# Patient Record
Sex: Female | Born: 1960 | Race: White | Hispanic: No
Health system: Midwestern US, Community
[De-identification: ages and names within clinical notes are randomized; demographics above are authoritative.]

## PROBLEM LIST (undated history)

## (undated) DIAGNOSIS — S52571A Other intraarticular fracture of lower end of right radius, initial encounter for closed fracture: Secondary | ICD-10-CM

---

## 2006-02-24 IMAGING — US US PELVIS COMPLETE MODIFY
1 series · 14 of 25 positions shown · non-contrast
Comparison: None.

CLINICAL DATA: Menorrhagia.

TRANSABDOMINAL AND TRANSVAGINAL PELVIC ULTRASOUND 10/09/2004:
TECHNIQUE: Initially, transabdominal imaging was performed using the bladder as
an acoustical window. Subsequently, the bladder was emptied and transvaginal
imaging was performed.

[Series 1: unknown · 0.22mm/px · 14 of 57 slices shown]
[im 1/57]
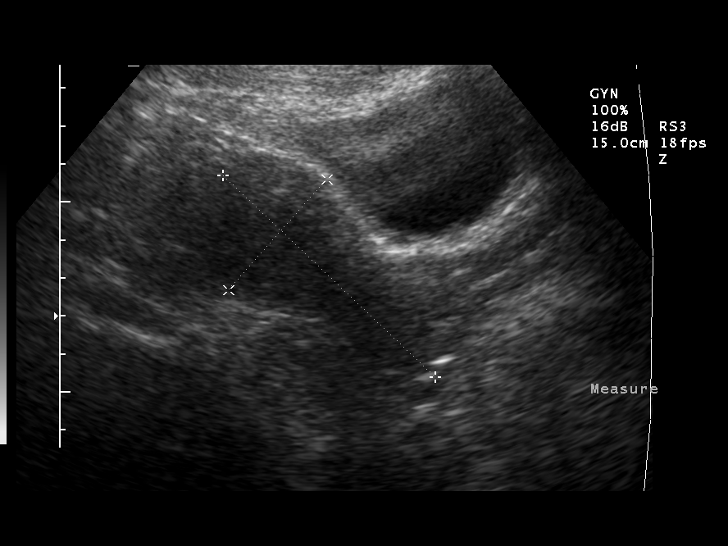
[im 5/57]
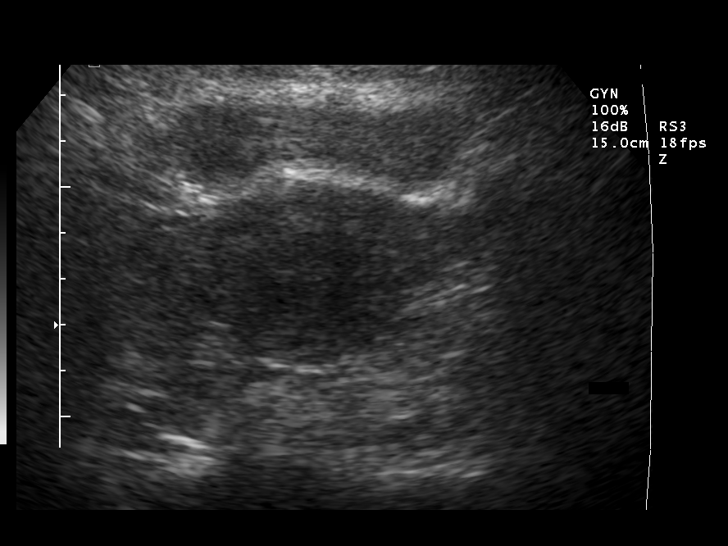
[im 10/57]
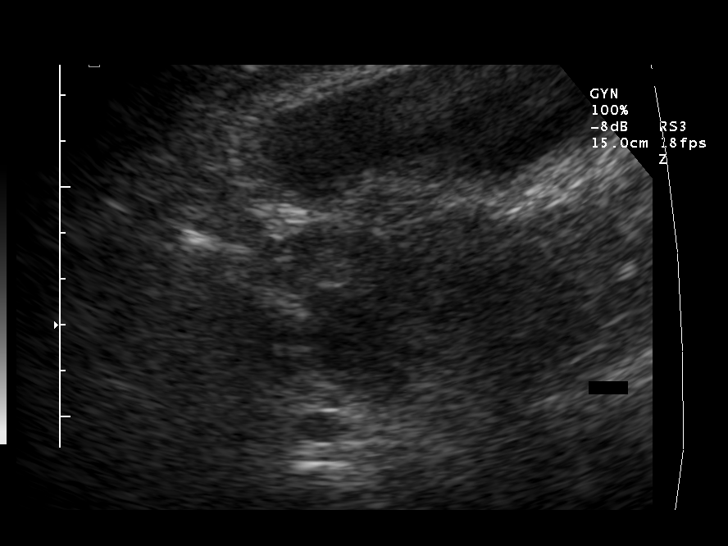
[im 15/57]
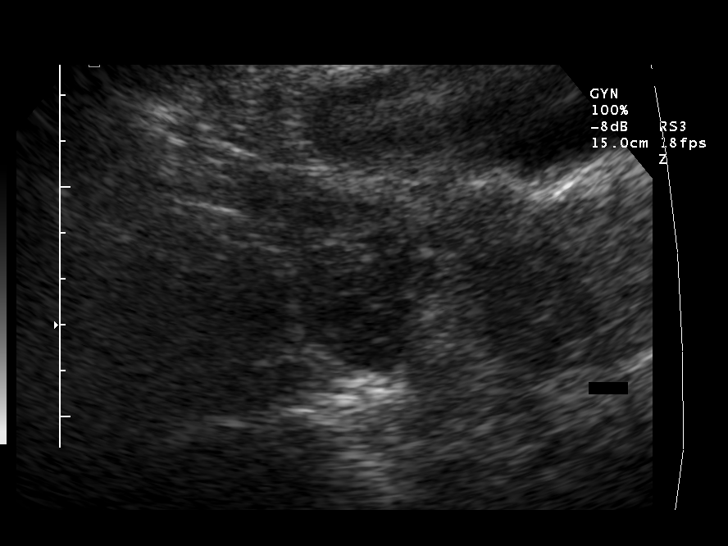
[im 19/57]
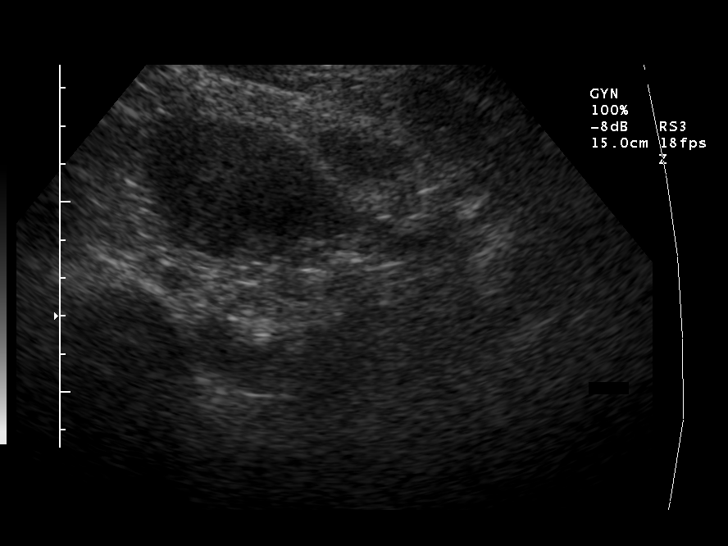
[im 22/57]
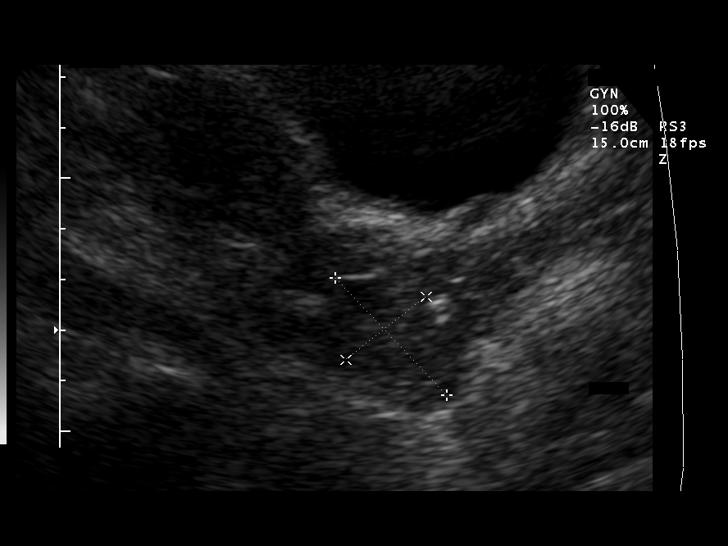
[im 26/57]
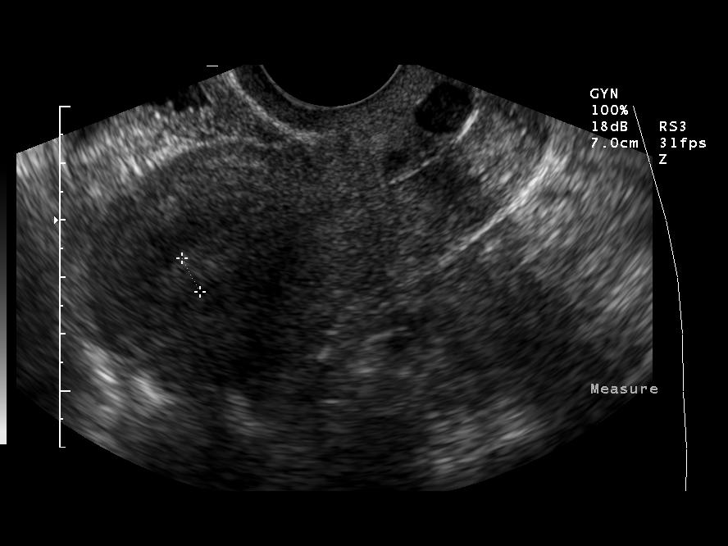
[im 31/57]
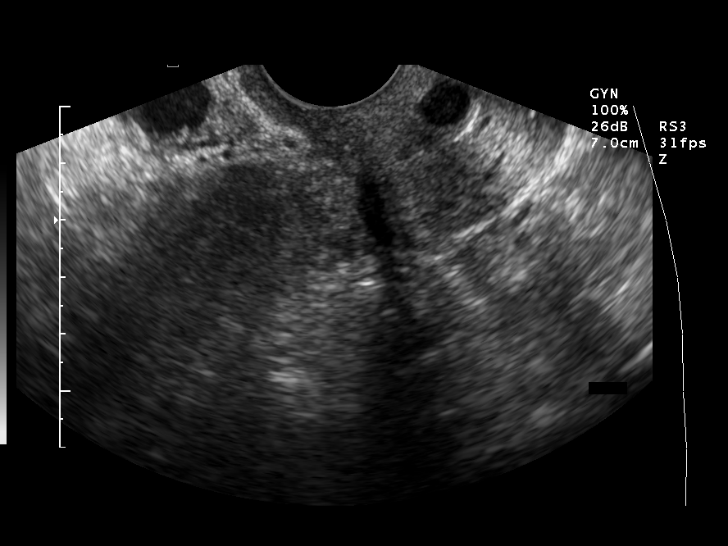
[im 36/57]
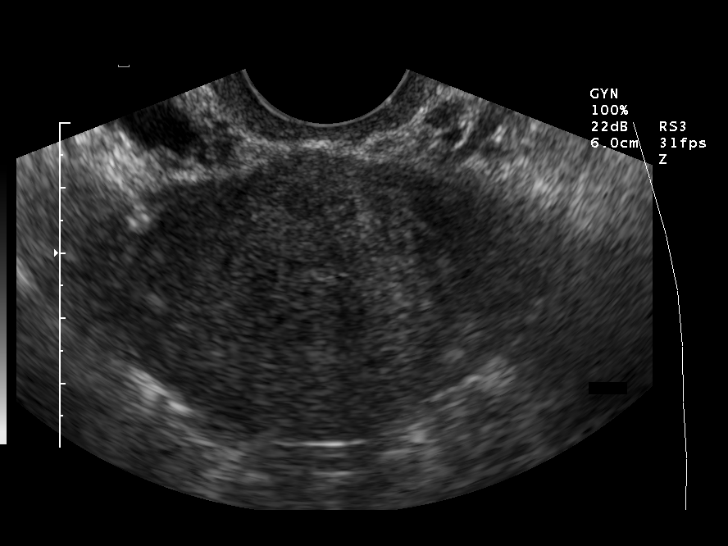
[im 38/57]
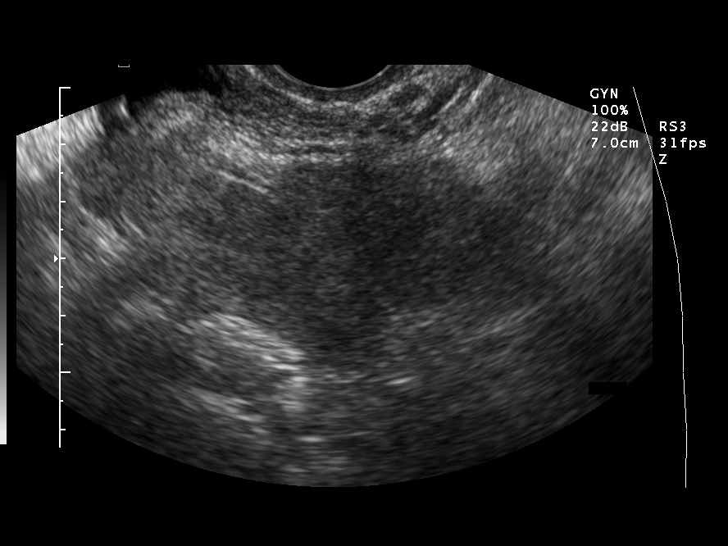
[im 43/57]
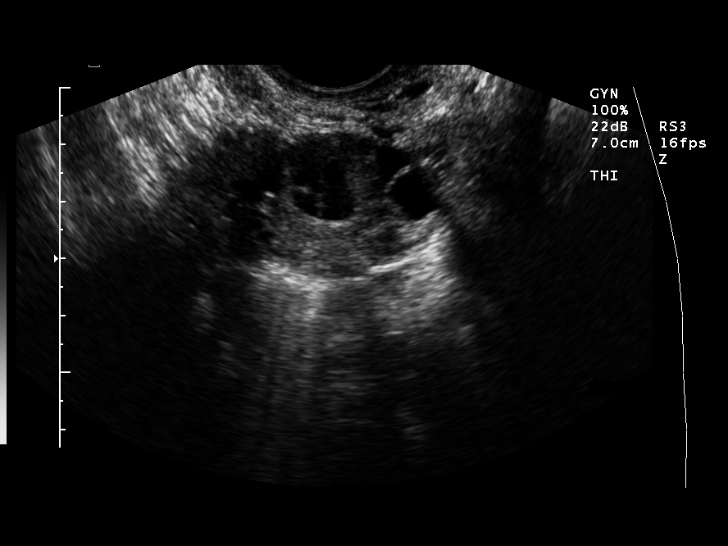
[im 47/57]
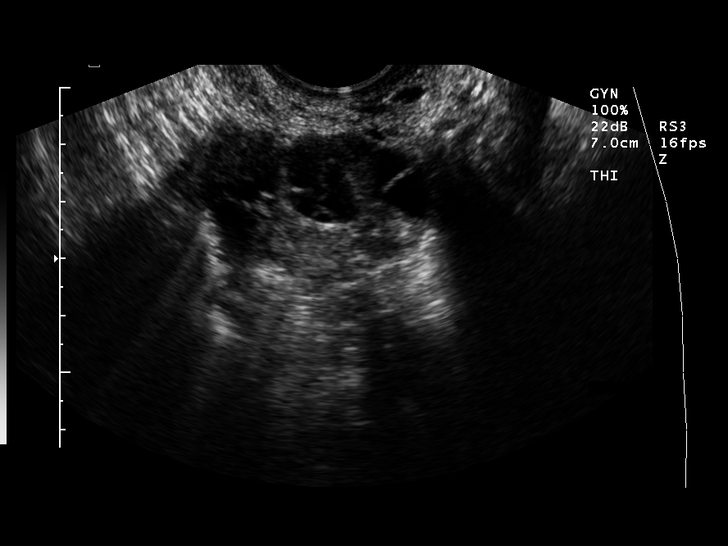
[im 52/57]
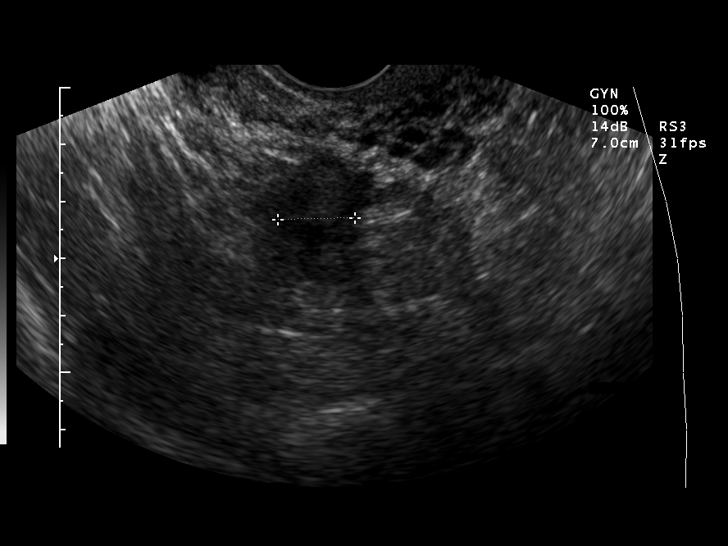
[im 57/57]
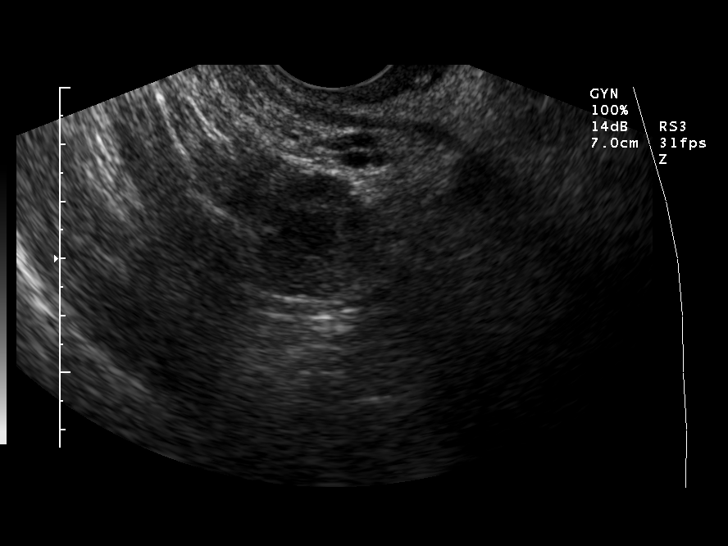

[14 of 25 positions shown; findings below may reference images not displayed]

FINDINGS: The uterus is normal in size and contains no myometrial
abnormalities. The uterus measures approximately 9.3 x 4.2 x 6.7 cm. The
endometrium is normal in appearance and measures approximately 7 mm in
thickness.

The right ovary is normal in size measuring approximately 4.2 x 2.5 x 2.4 cm.
There is an approximately 2 cm cyst with internal septations on the right ovary.
Several small follicular cysts are also present.

The left ovary measures approximately 3.0 x 1.6 x 1.4 cm. It is normal in
appearance. There is no evidence of free fluid.
IMPRESSION: 1. Approximately 2 cm minimally complex cyst in the right ovary.
2. Normal pelvic ultrasound otherwise. Specifically, no evidence of uterine
fibroids.

## 2021-12-30 ENCOUNTER — Inpatient Hospital Stay
Admit: 2021-12-30 | Discharge: 2021-12-30 | Disposition: A | Discharge status: Home / Self Care | Payer: BLUE CROSS/BLUE SHIELD | Attending: Student in an Organized Health Care Education/Training Program

## 2021-12-30 ENCOUNTER — Emergency Department: Admit: 2021-12-30 | Payer: BLUE CROSS/BLUE SHIELD | Primary: Family Medicine

## 2021-12-30 DIAGNOSIS — R519 Headache, unspecified: Secondary | ICD-10-CM

## 2021-12-30 DIAGNOSIS — W1830XA Fall on same level, unspecified, initial encounter: Secondary | ICD-10-CM

## 2021-12-30 MED ORDER — KETOROLAC TROMETHAMINE 30 MG/ML IJ SOLN
30 MG/ML | INTRAMUSCULAR | Status: AC
Start: 2021-12-30 — End: 2021-12-30
  Administered 2021-12-30: 21:00:00 30 mg via INTRAMUSCULAR

## 2021-12-30 MED FILL — KETOROLAC TROMETHAMINE 30 MG/ML IJ SOLN: 30 MG/ML | INTRAMUSCULAR | Qty: 1

## 2021-12-30 NOTE — ED Provider Notes (Signed)
[facility]  EMERGENCY DEPARTMENT ENCOUNTER      Pt Name: Michele Dickson  MRN: [id number]  [place] 08-05-1960  Date of evaluation: 12/30/2021  Provider: Sheran Spine, APRN - NP    [place]       Chief Complaint   Patient presents with    Fall    Head Injury    Eye Problem         HISTORY OF PRESENT ILLNESS   (Location/Symptom, Timing/Onset, Context/Setting, Quality, Duration, Modifying Factors, Severity)  Note limiting factors.   Patient is a 61 year old female with past medical history of high blood pressure, anxiety presenting to the emergency department for evaluation of headache.  Patient reports that approximately 2 days ago she had a mechanical ground-level fall.  Fell onto pavement, and is unsure if she hit her head or not.  No loss of consciousness.  Since that time, has noticed redness in her right eye and is had persistent headaches.  States that the pain is bearable.  Took aspirin without symptom relief.  Not taking any blood thinners.  No neck pain.  No other complaints.    The history is provided by the patient.         Review of External Medical Records:     Nursing Notes were reviewed.    REVIEW OF SYSTEMS    (2-9 systems for level 4, 10 or more for level 5)     Review of Systems   Constitutional:  Negative for unexpected weight change.   HENT:  Negative for congestion.    Eyes:  Negative for visual disturbance.   Respiratory:  Negative for cough and shortness of breath.    Cardiovascular:  Negative for chest pain and palpitations.   Gastrointestinal:  Negative for abdominal pain, nausea and vomiting.   Endocrine: Negative for polyuria.   Genitourinary:  Negative for dysuria and flank pain.   Musculoskeletal:  Negative for back pain.   Skin:  Negative for pallor.   Allergic/Immunologic: Negative for immunocompromised state.   Neurological:  Positive for light-headedness and headaches. Negative for dizziness.   Hematological:  Negative for adenopathy.   Psychiatric/Behavioral:   Negative for agitation.        Except as noted above the remainder of the review of systems was reviewed and negative.       PAST MEDICAL HISTORY   No past medical history on file.      SURGICAL HISTORY     No past surgical history on file.      CURRENT MEDICATIONS       Previous Medications    No medications on file       ALLERGIES     Amoxicillin    FAMILY HISTORY     No family history on file.       SOCIAL HISTORY              PHYSICAL EXAM    (up to 7 for level 4, 8 or more for level 5)     ED Triage Vitals [12/30/21 1415]   BP Temp Temp Source Pulse Respirations SpO2 Height Weight - Scale   (!) 152/96 99.5 F (37.5 C) Oral 62 18 98 % 1.524 m (5') 64.4 kg (142 lb)       Body mass index is 27.73 kg/m.    Physical Exam  Vitals and nursing note reviewed.   Constitutional:       General: She is not in acute distress.  Appearance: Normal appearance. She is normal weight.   Eyes:      Extraocular Movements: Extraocular movements intact.      Conjunctiva/sclera: Conjunctivae normal.      Pupils: Pupils are equal, round, and reactive to light.     Cardiovascular:      Rate and Rhythm: Normal rate.   Pulmonary:      Effort: Pulmonary effort is normal. No respiratory distress.   Abdominal:      General: Abdomen is flat.   Musculoskeletal:         General: Normal range of motion.      Cervical back: Neck supple. No tenderness.   Skin:     General: Skin is warm and dry.      Capillary Refill: Capillary refill takes less than 2 seconds.   Neurological:      General: No focal deficit present.      Mental Status: She is alert and oriented to person, place, and time. Mental status is at baseline.      GCS: GCS eye subscore is 4. GCS verbal subscore is 5. GCS motor subscore is 6.      Cranial Nerves: Cranial nerves 2-12 are intact. No facial asymmetry.      Sensory: Sensation is intact.      Motor: Motor function is intact. No pronator drift.      Coordination: Coordination is intact. Finger-Nose-Finger Test normal.    Psychiatric:         Mood and Affect: Mood normal.         Behavior: Behavior normal.             EMERGENCY DEPARTMENT COURSE and DIFFERENTIAL DIAGNOSIS/MDM:   Vitals:    Vitals:    12/30/21 1415   BP: (!) 152/96   Pulse: 62   Resp: 18   Temp: 99.5 F (37.5 C)   TempSrc: Oral   SpO2: 98%   Weight: 64.4 kg (142 lb)   Height: 1.524 m (5')           Medical Decision Making   Patient presenting with headache, lightheadedness after ground-level fall, unclear if she hit her head or not.  Physical exam revealing pleasant, well-appearing female in no acute distress.  Neurological exam nonfocal.  Plan to obtain head CT to rule out intracranial hemorrhage, CT cervical spine to rule out fracture.  Other differential including postconcussive syndrome, benign headache.  Explained to patient that the redness in her eye  is a subconjunctival hemorrhage and will likely resolve on its own in 2 to 3 weeks without intervention.    1520 -head CT without evidence of intracranial hemorrhage or other abnormality.  CT cervical spine without fracture.  Patient treated with dose of Toradol while in emergency department.  Discussed symptomatic care at home with ibuprofen, acetaminophen as needed.  Possible postconcussive syndrome.  Encourage close PCP follow-up.  Discussed specific reasons to return to the emergency department.  Discussed my clinical impression(s), any labs and/or radiology results with the patient. I answered any questions and addressed any concerns. Discussed the importance of following up with their primary care physician and/or specialist(s). Discussed signs or symptoms that would warrant return back to the ER for further evaluation. The patient is agreeable with discharge.    Amount and/or Complexity of Data Reviewed  Radiology: ordered.    Risk  Prescription drug management.                  (Please note that  portions of this note were completed with a voice recognition program.  Efforts were made to edit the  dictations but occasionally words are mis-transcribed.)    Hewitt Shorts, APRN - NP (electronically signed)  Nurse Practitioner      Hewitt Shorts, APRN - NP  12/30/21 1537

## 2021-12-30 NOTE — Discharge Instructions (Addendum)
Thank you for allowing us to provide you with medical care today.   We realize that you have many choices for your emergency care needs.  We thank you for choosing [facility].  Please choose us in the future for any continued health care needs.  The exam and treatment you received in the emergency department were for an emergent problem and are not intended as complete care.  It is important that you follow-up with a doctor.  If your symptoms worsen or you do not improve you should return to the emergency department.  We are available 24 hours a day.  Please make an appointment with your health care provider for follow-up of your emergency department visit.  Take this sheet with you when you go to your follow-up visit.

## 2021-12-30 NOTE — ED Triage Notes (Signed)
Patient arrives with c/o redness to the right eye after a fall and hitting her head on the pavement. Patient fell outside of her apartment after tripping over her feet. Neighbors witnessed the fall. Denies LOC. Denies blood thinner. Took aspirin the last 2 days.

## 2022-09-18 ENCOUNTER — Emergency Department: Admit: 2022-09-18 | Payer: BLUE CROSS/BLUE SHIELD | Primary: Family Medicine

## 2022-09-18 ENCOUNTER — Inpatient Hospital Stay
Admit: 2022-09-18 | Discharge: 2022-09-19 | Disposition: A | Discharge status: Home / Self Care | Payer: BLUE CROSS/BLUE SHIELD

## 2022-09-18 DIAGNOSIS — R0789 Other chest pain: Secondary | ICD-10-CM

## 2022-09-18 DIAGNOSIS — F439 Reaction to severe stress, unspecified: Secondary | ICD-10-CM

## 2022-09-18 LAB — COMPREHENSIVE METABOLIC PANEL W/ REFLEX TO MG FOR LOW K
ALT: 31 U/L (ref 12–78)
AST: 44 U/L — ABNORMAL HIGH (ref 15–37)
Albumin/Globulin Ratio: 1.2 (ref 1.1–2.2)
Albumin: 4.2 g/dL (ref 3.5–5.0)
Alk Phosphatase: 88 U/L (ref 45–117)
Anion Gap: 11 mmol/L (ref 5–15)
BUN/Creatinine Ratio: 20 (ref 12–20)
BUN: 15 MG/DL (ref 6–20)
CO2: 27 mmol/L (ref 21–32)
Calcium: 9.2 MG/DL (ref 8.5–10.1)
Chloride: 99 mmol/L (ref 97–108)
Creatinine: 0.74 MG/DL (ref 0.55–1.02)
Est, Glom Filt Rate: >90 mL/min/{1.73_m2} (ref >60)
Globulin: 3.5 g/dL (ref 2.0–4.0)
Glucose: 103 mg/dL — ABNORMAL HIGH (ref 65–100)
Potassium: 3.5 mmol/L (ref 3.5–5.1)
Sodium: 137 mmol/L (ref 136–145)
Total Bilirubin: 1.2 MG/DL — ABNORMAL HIGH (ref 0.2–1.0)
Total Protein: 7.7 g/dL (ref 6.4–8.2)

## 2022-09-18 LAB — CBC WITH AUTO DIFFERENTIAL
Basophils %: 0 % (ref 0–1)
Basophils Absolute: 0 10*3/uL (ref 0.0–0.1)
Eosinophils %: 1 % (ref 0–7)
Eosinophils Absolute: 0 10*3/uL (ref 0.0–0.4)
Hematocrit: 42 % (ref 35.0–47.0)
Hemoglobin: 14 g/dL (ref 11.5–16.0)
Immature Granulocytes %: 0 % (ref 0.0–0.5)
Immature Granulocytes Absolute: 0 10*3/uL (ref 0.00–0.04)
Lymphocytes %: 35 % (ref 12–49)
Lymphocytes Absolute: 2.4 10*3/uL (ref 0.8–3.5)
MCH: 29.9 PG (ref 26.0–34.0)
MCHC: 33.3 g/dL (ref 30.0–36.5)
MCV: 89.6 FL (ref 80.0–99.0)
MPV: 8.8 FL — ABNORMAL LOW (ref 8.9–12.9)
Monocytes %: 7 % (ref 5–13)
Monocytes Absolute: 0.5 10*3/uL (ref 0.0–1.0)
Neutrophils %: 57 % (ref 32–75)
Neutrophils Absolute: 3.9 10*3/uL (ref 1.8–8.0)
Nucleated RBCs: 0 PER 100 WBC
Platelets: 229 10*3/uL (ref 150–400)
RBC: 4.69 M/uL (ref 3.80–5.20)
RDW: 13 % (ref 11.5–14.5)
WBC: 6.8 10*3/uL (ref 3.6–11.0)
nRBC: 0 10*3/uL (ref 0.00–0.01)

## 2022-09-18 LAB — MAGNESIUM: Magnesium: 1.7 mg/dL (ref 1.6–2.4)

## 2022-09-18 NOTE — ED Provider Notes (Signed)
[facility]  EMERGENCY DEPARTMENT ENCOUNTER      Pt Name: Michele Dickson  MRN: [id number]  Birthdate Jul 02, 1960  Date of evaluation: 09/18/2022  Provider: Alice Reichert, MD    CHIEF COMPLAINT       Chief Complaint   Patient presents with    Chest Pain         HISTORY OF PRESENT ILLNESS   (Location/Symptom, Timing/Onset, Context/Setting, Quality, Duration, Modifying Factors, Severity)  Note limiting factors.   62 y.o. female patient presents with a chief complaint of chest pain, described as discomfort and tightness in the midsternal area that began last night and persisted into this morning. The pain radiates between the shoulders and is associated with elevated blood pressure. She reports recent stress, including news of a friend's suicide. The chest discomfort subsided during the first day but flared up again.  The patient has a history of hypertension on medication. No numbness or tingling reported. She denies any recent surgeries or injuries, but had a double mastectomy 3 years ago for breast cancer and is currently not undergoing any cancer treatment. She reports a negative experience with previous cancer treatment including over-medication, necrosis, and multiple surgeries. She reports a family history of sleep apnea but denies having it herself, though mentions having had some trouble breathing while sleeping in the past. 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 She attended two recovery meetings today, one of which discussed friend's suicide in detail, which worsened sx.        Nursing Notes were reviewed.    REVIEW OF SYSTEMS    (2-9 systems for level 4, 10 or more for level 5)     Review of Systems    Except as noted above the remainder of the review of systems was reviewed and negative.       PAST MEDICAL HISTORY   History reviewed. No pertinent past medical history.      SURGICAL HISTORY       Past Surgical History:    Procedure Laterality Date    MASTECTOMY, BILATERAL           CURRENT MEDICATIONS       Discharge Medication List as of 09/18/2022 11:42 PM        CONTINUE these medications which have NOT CHANGED    Details   lisinopril (PRINIVIL;ZESTRIL) 10 MG tablet Take 1 tablet by mouth dailyHistorical Med      buPROPion (WELLBUTRIN XL) 300 MG extended release tablet TAKE 1 TABLET BY MOUTH EVERY DAY IN THE MORNING FOR 90 DAYS Oral for 90 DaysHistorical Med      escitalopram (LEXAPRO) 20 MG tablet TAKE 1 TABLET BY MOUTH EVERY DAY FOR 90 DAYS Oral for 90 DaysHistorical Med             ALLERGIES     Amoxicillin    FAMILY HISTORY     History reviewed. No pertinent family history.       SOCIAL HISTORY       Social History     Socioeconomic History    Marital status: Widowed     Spouse name: None    Number of children: None    Years of education: None    Highest education level: None   Tobacco Use    Smoking status: Never    Smokeless tobacco: Never   Substance and Sexual Activity    Alcohol use: Not Currently  Drug use: Not Currently           PHYSICAL EXAM    (up to 7 for level 4, 8 or more for level 5)     ED Triage Vitals [09/18/22 1915]   BP Temp Temp Source Pulse Respirations SpO2 Height Weight - Scale   (!) 219/122 99.5 F (37.5 C) Tympanic 90 16 100 % 1.524 m (5') 75.6 kg (166 lb 10.7 oz)       Body mass index is 32.55 kg/m.    Physical Exam  Vitals and nursing note reviewed.   Constitutional:       General: She is not in acute distress.     Appearance: Normal appearance. She is not ill-appearing or toxic-appearing.   HENT:      Head: Normocephalic and atraumatic.      Mouth/Throat:      Mouth: Mucous membranes are moist.   Cardiovascular:      Rate and Rhythm: Normal rate and regular rhythm.      Pulses: Normal pulses.      Heart sounds: Normal heart sounds.   Pulmonary:      Effort: Pulmonary effort is normal. No respiratory distress.      Breath sounds: Normal breath sounds.   Abdominal:      General: Abdomen is flat.  There is no distension.   Musculoskeletal:         General: No swelling.      Right lower leg: No edema.      Left lower leg: No edema.   Skin:     General: Skin is warm and dry.      Capillary Refill: Capillary refill takes less than 2 seconds.   Neurological:      General: No focal deficit present.      Mental Status: She is alert and oriented to person, place, and time. Mental status is at baseline.         DIAGNOSTIC RESULTS     EKG:   All EKG's are interpreted by an Emergency Department Physician who either signs or Co-signs this chart in the absence of a cardiologist. Interpretation provided in ED course below    RADIOLOGY:   Non-plain film images such as CT, Ultrasound and MRI are read by the radiologist. Plain radiographic images are visualized and preliminarily interpreted by the emergency physician with the findings as noted in the ED course.    Interpretation per the Radiologist below, if available at the time of this note:    XR CHEST (2 VW)   Final Result   No acute cardiopulmonary process      Electronically signed by Lubertha South           LABS:  Labs Reviewed   CBC WITH AUTO DIFFERENTIAL - Abnormal; Notable for the following components:       Result Value    MPV 8.8 (*)     All other components within normal limits   COMPREHENSIVE METABOLIC PANEL W/ REFLEX TO MG FOR LOW K - Abnormal; Notable for the following components:    Glucose 103 (*)     Total Bilirubin 1.2 (*)     AST 44 (*)     All other components within normal limits   TROPONIN   MAGNESIUM   TROPONIN       All other labs were within normal range or not returned as of this dictation.    EMERGENCY DEPARTMENT COURSE and DIFFERENTIAL DIAGNOSIS/MDM:   Vitals:  Vitals:    09/18/22 2300 09/18/22 2315 09/18/22 2330 09/18/22 2345   BP: 131/83 137/87 124/87 (!) 141/88   Pulse: 71 72 71 80   Resp: 19 21 18 15    Temp:       TempSrc:       SpO2: 95% 95% 95% 95%   Weight:       Height:               Medical Decision Making  This patient presents with  chest pain, with symptoms suggestive of noncardiac chest pain. History without high risk features (no diaphoresis, no exertional component, not relieved with rest.)    CAD risk factors reviewed and notable for moderate age, no known CAD, +HTN but no hx of DM/HLD, no tobacco/stimulant use, no family history.  Exam without evidence of volume overload.   EKG without signs of active ischemia.     Given the timing of pain to ER presentation, plan to send high-sensitivity troponin (with plan for single troponin if <5 +/- delta troponin eval) to evaluate for NSTEMI. Presentation not consistent with acute PE (Wells low risk), pneumothorax, thoracic arotic dissection, cardiac effusion or tamponade.    Lab/imaging workup obtained  HEART score: 3.    Discussed findings, likely diagnosis, likely course, symptomatic management, need for follow-up, and return precautions with patient        Amount and/or Complexity of Data Reviewed  Labs: ordered. Decision-making details documented in ED Course.  Radiology: ordered. Decision-making details documented in ED Course.  ECG/medicine tests: ordered.    Risk  Prescription drug management.        ED Course as of 09/19/22 0055   Wed Sep 18, 2022   1925 EKG independently reviewed by me:    EKG obtained at 1923. Noted with sinus rhythm at a rate of 89.  Right axis deviation and normal intervals. Narrow complex QRS.  Nonspecific ST depressions to V5-6. No ectopy or ischemia noted. [RS]   2058 WBC: 6.8 [RS]   2058 Hemoglobin Quant: 14.0 [RS]   2058 Troponin, High Sensitivity: 6 [RS]   2058 Sodium: 137 [RS]   2058 Potassium: 3.5 [RS]   2058 Creatinine: 0.74 [RS]   2058 Independently viewed CXR; no PTX/PNA or mediastinal changes noted, per my read   [RS]   2208 Troponin, High Sensitivity: 7 [RS]   2208 XR CHEST (2 VW)  FINDINGS: PA and lateral radiographs of the chest demonstrate clear lungs. The  cardiac and mediastinal contours and pulmonary vascularity are normal. The bones  and soft tissues  are within normal limits.    [RS]      ED Course User Index  [RS] Alice Reichert, MD           CONSULTS:  None    PROCEDURES:  Unless otherwise noted below, none     Procedures    MEDS:  Medications   LORazepam (ATIVAN) tablet 1 mg (1 mg Oral Given 09/18/22 2021)           FINAL IMPRESSION      1. Chest pain due to psychological stress          DISPOSITION/PLAN   DISPOSITION   Decision To Discharge  09/18/2022 11:42:24 PM      PATIENT REFERRED TO:  Ann Lions, MD  [address]  [address]  [place] [address]  [phone]      As needed    [facility]  [phone]  [address]  [place] [address]  [phone]    If symptoms worsen      DISCHARGE MEDICATIONS:  Discharge Medication List as of 09/18/2022 11:42 PM        START taking these medications    Details   hydrOXYzine pamoate (VISTARIL) 25 MG capsule Take 1 capsule by mouth 3 times daily as needed (insomnia, anxiety, or panic attack), Disp-10 capsule, R-0Normal               (Please note that portions of this note were completed with a voice recognition program.  Efforts were made to edit the dictations but occasionally words are mis-transcribed.)    Diesel Lina Mathis Bud, MD (electronically signed)  Emergency Attending Physician              Alice Reichert, MD  09/23/22 1630

## 2022-09-18 NOTE — ED Notes (Signed)
Patient given discharge papers and instructions by this RN. Patient verbalized understanding and stated not having questions or concerns regarding her care. Patient ambulatory out of ED in no new acute distress.

## 2022-09-18 NOTE — Discharge Instructions (Signed)
The cause of your symptoms is not exactly known still, but you do not have evidence of heart attack, tumor/mass, pneumonia, collapsed lung, blood clots, or broken ribs. The cause of your pain does not appear to be life-threatening at this time. Your labs and imaging appear reassuring.     Because your symptoms are better controlled, we are allowing you to go home.  Use medications as prescribed. Contact your primary care provider or the provided specialists for further evaluation.    It is rare but possible that some things can be missed on your initial workup; if you notice pain that cannot be controlled, difficulty breathing, nausea/vomiting, fevers, weakness, large amounts of blood in your stool or vomit, unable to urinate, or you are becoming confused or unable to get out of bed, return to the ER for evaluation.

## 2022-09-18 NOTE — ED Triage Notes (Addendum)
Patient arrives with tightness in her chest, left shoulder pain, and left arm pain since this morning. She also had an episode of this yesterday. Her BP today was 180s SBP.  States she is under a lot of stress right now.     She took two ASA this morning, and two more at 1700.

## 2022-09-19 LAB — EKG 12-LEAD
Atrial Rate: 89 {beats}/min
Diagnosis: NORMAL
P-R Interval: 134 ms
Q-T Interval: 384 ms
QRS Duration: 72 ms
QTc Calculation (Bazett): 467 ms
R Axis: 199 degrees
T Axis: 170 degrees
Ventricular Rate: 89 {beats}/min

## 2022-09-19 LAB — TROPONIN
Troponin, High Sensitivity: 6 ng/L (ref 0–51)
Troponin, High Sensitivity: 7 ng/L (ref 0–51)

## 2022-09-19 MED ORDER — LORAZEPAM 1 MG PO TABS
1 | Freq: Once | ORAL | Status: AC
Start: 2022-09-19 — End: 2022-09-18
  Administered 2022-09-19: 1 mg via ORAL

## 2022-09-19 MED ORDER — HYDROXYZINE PAMOATE 25 MG PO CAPS
25 MG | ORAL_CAPSULE | Freq: Three times a day (TID) | ORAL | 0 refills | Status: AC | PRN
Start: 2022-09-19 — End: 2022-09-21

## 2022-09-19 MED FILL — LORAZEPAM 1 MG PO TABS: 1 MG | ORAL | Qty: 1

## 2023-05-26 ENCOUNTER — Encounter

## 2023-05-27 ENCOUNTER — Inpatient Hospital Stay: Admit: 2023-05-27 | Payer: BLUE CROSS/BLUE SHIELD | Attending: Orthopaedic Surgery | Primary: Family Medicine

## 2023-05-27 DIAGNOSIS — S52571A Other intraarticular fracture of lower end of right radius, initial encounter for closed fracture: Secondary | ICD-10-CM

## 2023-12-23 ENCOUNTER — Encounter: Payer: BLUE CROSS/BLUE SHIELD | Attending: Specialist | Primary: Family Medicine

## 2023-12-23 ENCOUNTER — Ambulatory Visit: Payer: BLUE CROSS/BLUE SHIELD | Primary: Family Medicine

## 2023-12-23 VITALS — Ht 60.0 in | Wt 145.0 lb

## 2023-12-23 DIAGNOSIS — S52571S Other intraarticular fracture of lower end of right radius, sequela: Principal | ICD-10-CM

## 2023-12-23 NOTE — Progress Notes (Signed)
"  This was an erroneous precharted office note for this patient who actually came not for a second opinion but for an impairment rating.  This was discussed with her, her appointment was canceled and may be rescheduled as a formal independent medical examination in the future if desired.  "
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
